# Patient Record
Sex: Female | Born: 1995 | Race: Black or African American | Hispanic: No | Marital: Single
Health system: Southern US, Community
[De-identification: ages and names within clinical notes are randomized; demographics above are authoritative.]

---

## 2019-11-12 ENCOUNTER — Emergency Department (HOSPITAL_COMMUNITY)
Admission: EM | Admit: 2019-11-12 | Discharge: 2019-11-13 | Disposition: A | Discharge status: Home / Self Care | Payer: No Typology Code available for payment source | Attending: Emergency Medicine | Admitting: Emergency Medicine

## 2019-11-12 ENCOUNTER — Emergency Department (HOSPITAL_COMMUNITY): Payer: No Typology Code available for payment source

## 2019-11-12 ENCOUNTER — Other Ambulatory Visit: Payer: Self-pay

## 2019-11-12 ENCOUNTER — Encounter (HOSPITAL_COMMUNITY): Payer: Self-pay | Admitting: Emergency Medicine

## 2019-11-12 DIAGNOSIS — M25511 Pain in right shoulder: Secondary | ICD-10-CM | POA: Diagnosis present

## 2019-11-12 DIAGNOSIS — M7918 Myalgia, other site: Secondary | ICD-10-CM | POA: Insufficient documentation

## 2019-11-12 DIAGNOSIS — M25531 Pain in right wrist: Secondary | ICD-10-CM | POA: Insufficient documentation

## 2019-11-12 DIAGNOSIS — R0789 Other chest pain: Secondary | ICD-10-CM | POA: Diagnosis not present
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
## 2019-11-12 NOTE — ED Notes (Signed)
Pt refused vital signs stating that she is in pain.This EMT explained the process and pt continues to state how ridiculous that the wait time. Pt refuses to allow this EMT to assess her.

## 2019-11-13 ENCOUNTER — Emergency Department (HOSPITAL_COMMUNITY): Payer: No Typology Code available for payment source

## 2019-11-13 DIAGNOSIS — M25511 Pain in right shoulder: Secondary | ICD-10-CM | POA: Diagnosis not present

## 2019-11-13 MED ORDER — NAPROXEN 500 MG PO TABS: 500.0000 mg | ORAL_TABLET | Freq: Two times a day (BID) | ORAL | 0 refills | Status: AC | PRN

## 2019-11-13 MED ORDER — METHOCARBAMOL 500 MG PO TABS
500.0000 mg | ORAL_TABLET | Freq: Three times a day (TID) | ORAL | 0 refills | Status: AC | PRN
Start: 2019-11-13 — End: ?

## 2019-11-13 MED ORDER — HYDROCODONE-ACETAMINOPHEN 5-325 MG PO TABS
1.0000 | ORAL_TABLET | Freq: Once | ORAL | Status: AC
  Administered 2019-11-13: 1 via ORAL
  Filled 2019-11-13: qty 1

## 2019-11-13 NOTE — ED Notes (Signed)
Pain to right shoulder and right wrist.  Laceration on right wrist is not deep and does not need stiches.  RN will re-wrap it.
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
## 2019-11-13 NOTE — ED Notes (Addendum)
Pt to Xray.
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
